# Patient Record
Sex: Male | Born: 1985 | Race: White | Hispanic: No | Marital: Married | Smoking: Former smoker
Health system: Western US, Academic
[De-identification: ages and names within clinical notes are randomized; demographics above are authoritative.]

## PROBLEM LIST (undated history)

## (undated) MED ORDER — TESTOSTERONE CYPIONATE 100 MG/ML IJ SOLN
0.75 mg | INTRAMUSCULAR | 0 refills | Status: AC
Start: 2021-10-27 — End: 2022-10-12

---
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
## 2020-06-17 IMAGING — MR MRI LSPINE WO CONTRAST
5 of 6 series · 35 of 48 positions shown · non-contrast
Comparison: None.

INDICATION: Low back pain.
TECHNIQUE: Multiplanar, multiecho MR imaging of the lumbar spine was performed, including T1-weighted and fluid sensitive sequences without intravenous contrast.

[Series 16: t2_sag · sagittal · 4.0mm · 0.77mm/px · 7 of 19 slices shown]
[im 1/19]
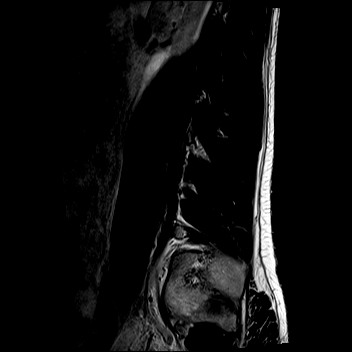
[im 4/19]
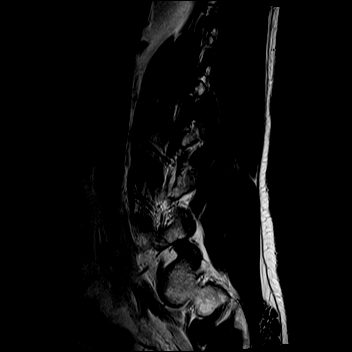
[im 7/19]
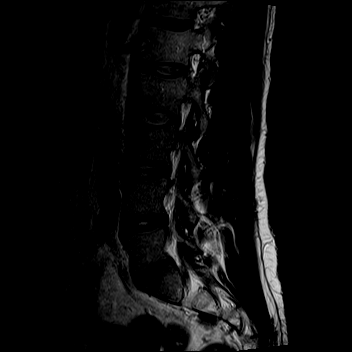
[im 10/19]
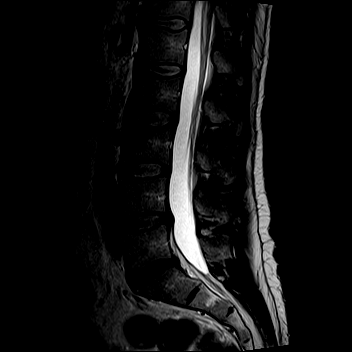
[im 13/19]
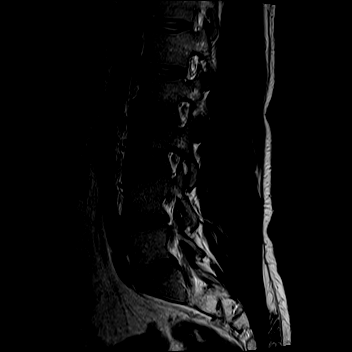
[im 16/19]
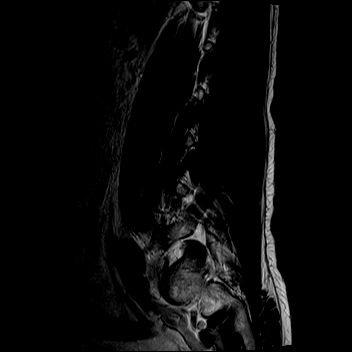
[im 19/19]
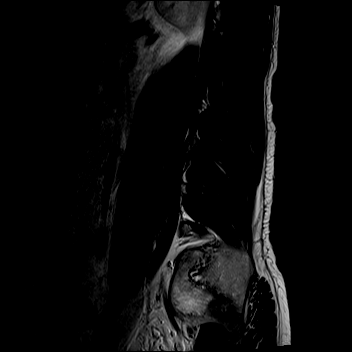

[Series 17: t1_sag · sagittal · 4.0mm · 0.84mm/px · 6 of 19 slices shown]
[im 1/19]
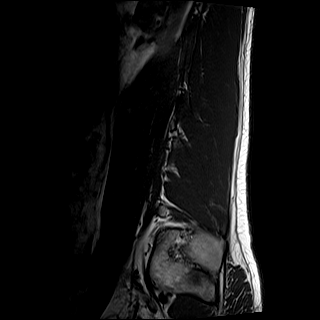
[im 4/19]
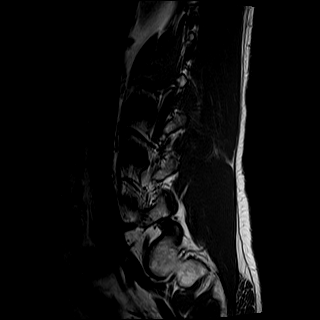
[im 8/19]
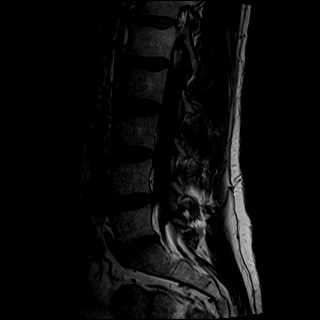
[im 11/19]
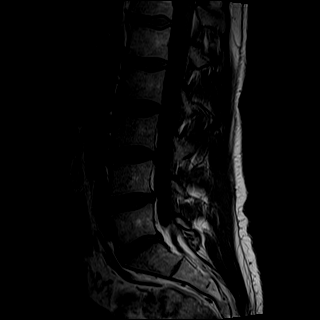
[im 15/19]
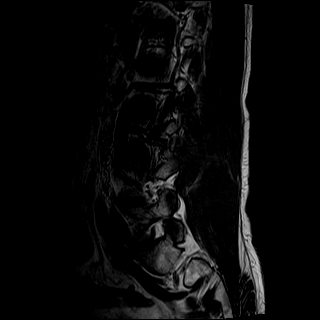
[im 19/19]
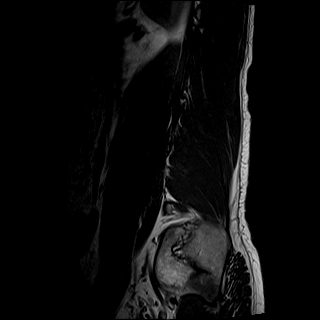

[Series 18: ir_sag · sagittal · 4.0mm · 0.47mm/px · 6 of 19 slices shown]
[im 1/19]
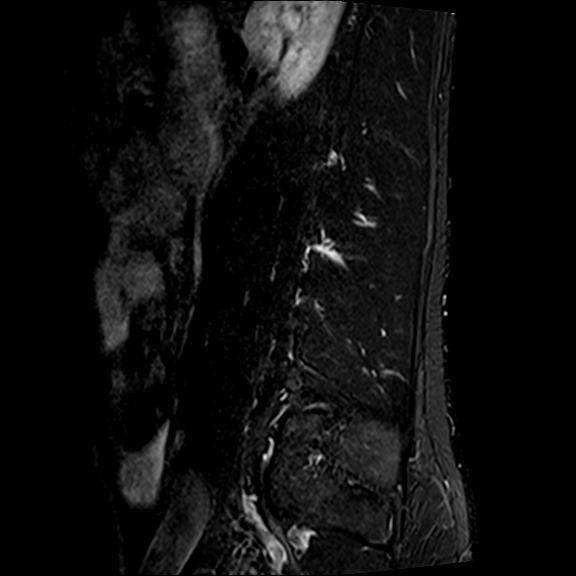
[im 4/19]
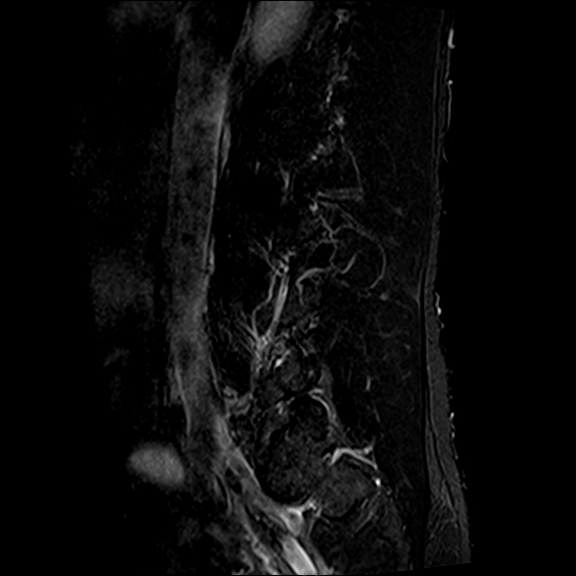
[im 8/19]
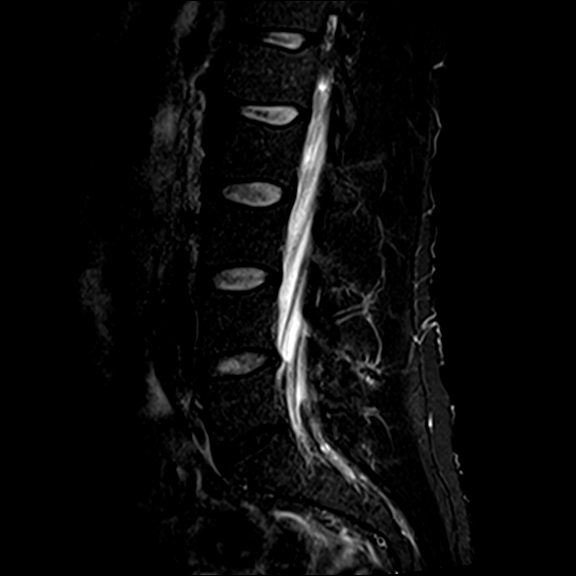
[im 11/19]
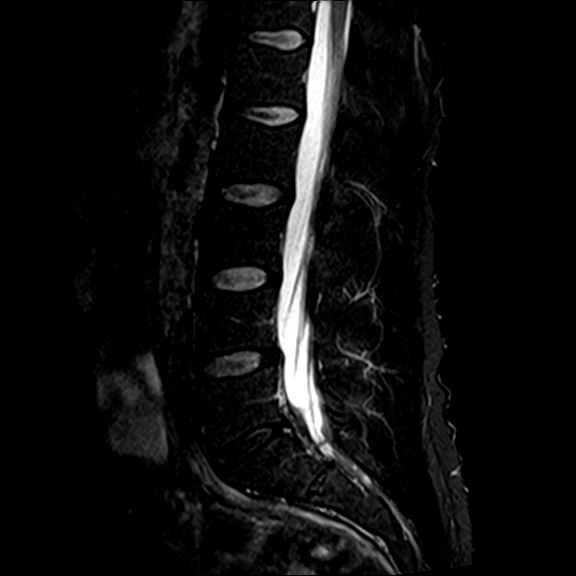
[im 15/19]
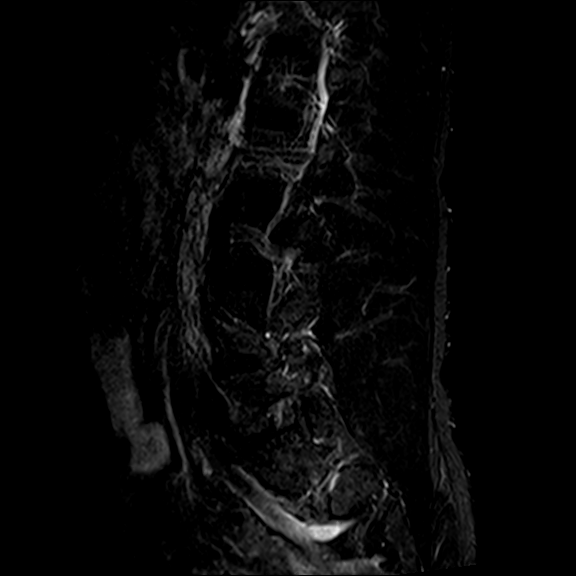
[im 19/19]
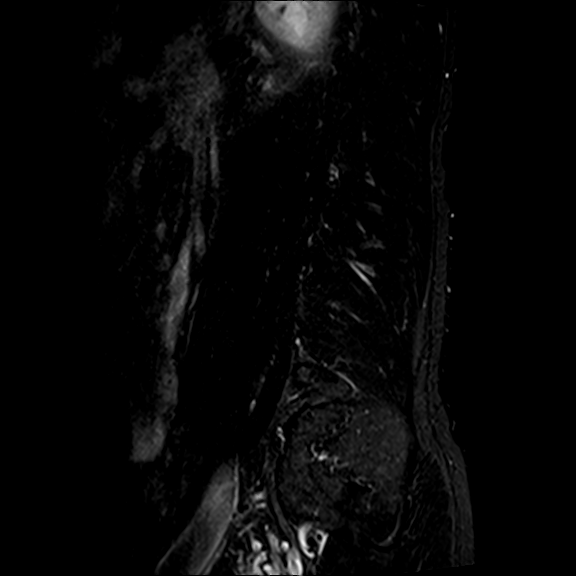

[Series 19: t2_axial · axial · 4.0mm · 0.62mm/px · z∈[-522,-321]mm · 11 of 50 slices shown]
[im 4/50]
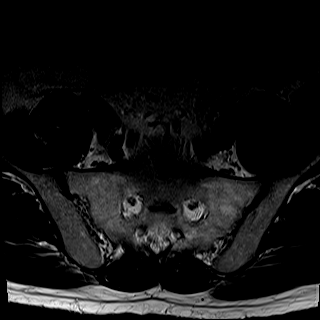
[im 7/50]
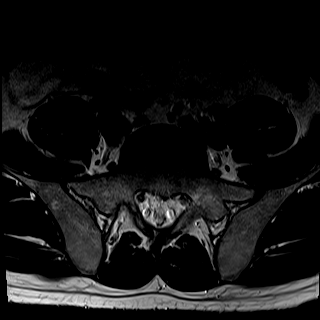
[im 10/50]
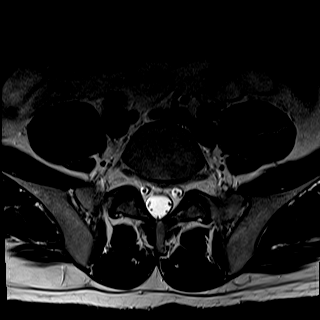
[im 16/50]
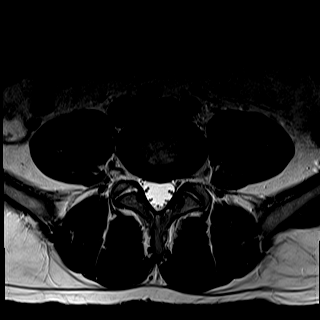
[im 22/50]
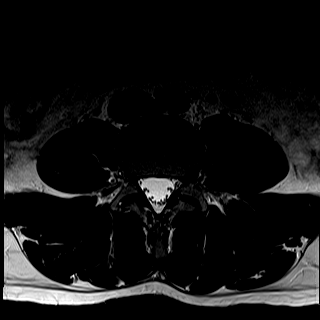
[im 25/50]
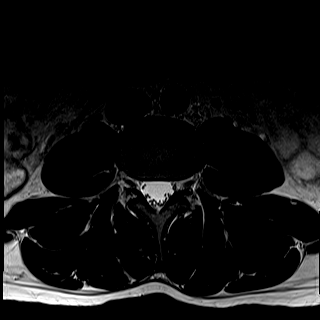
[im 28/50]
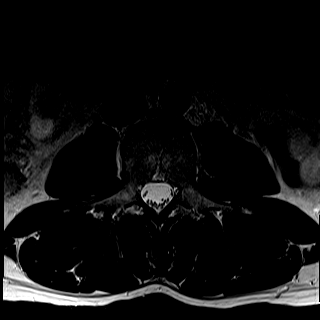
[im 34/50]
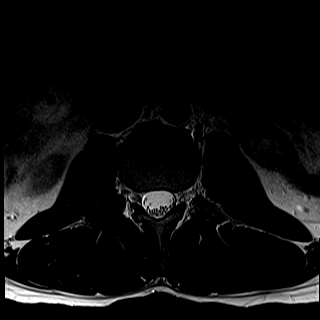
[im 40/50]
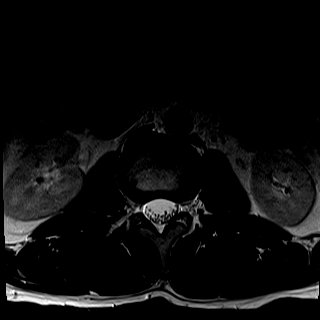
[im 43/50]
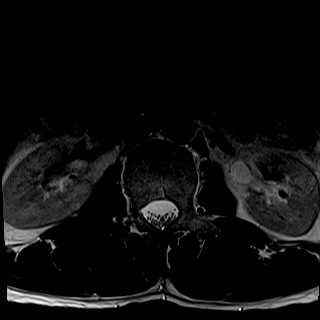
[im 46/50]
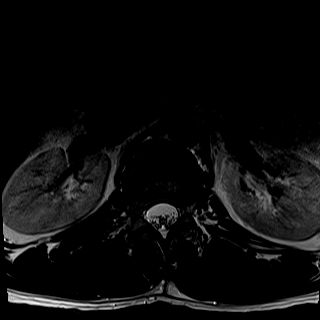

[Series 20: t1_axial_obl · axial · 3.0mm · 0.78mm/px · z∈[-540,-375]mm · 5 of 31 slices shown]
[im 1/31]
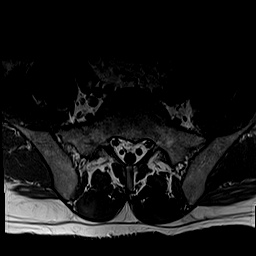
[im 7/31]
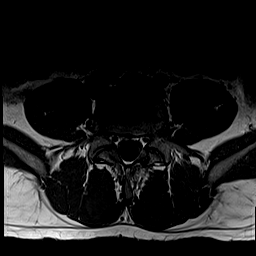
[im 10/31]
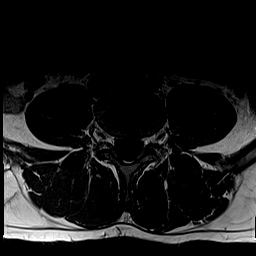
[im 13/31]
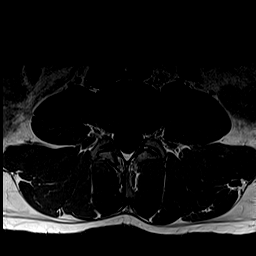
[im 19/31]
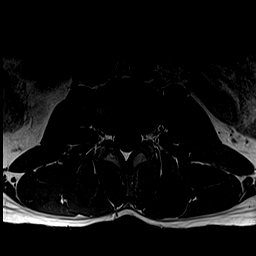

[35 of 48 positions shown; findings below may reference images not displayed]

FINDINGS: Conus: The conus is normal in appearance and position.  

Alignment: The alignment of the lumbar spine is normal on these supine, neutral images.   

Marrow: No acute fracture. No pars defect. Minimal discogenic endplate reactive marrow changes at L5-S1.  

T12-L1: No disc protrusion. No canal or foraminal stenosis. No facet arthrosis.

L1-L2: No disc protrusion, canal stenosis, or foraminal stenosis.

L2-L3: No disc protrusion, canal stenosis, or foraminal stenosis.

L3-L4: No disc protrusion. No canal stenosis. No foraminal stenosis.

L4-L5: Mild disc bulge. No facet arthrosis. No canal stenosis. No foraminal stenosis.

L5-S1: Mild disc bulge. No facet arthrosis. No canal or foraminal stenosis.

Visualized SI joints: Intact.

Visualized soft tissues: Unremarkable.
IMPRESSION: Mild disc bulge at L4-L5 and L5-S1 with no canal or foraminal stenosis.

## 2021-05-07 DIAGNOSIS — E237 Disorder of pituitary gland, unspecified: Secondary | ICD-10-CM | POA: Insufficient documentation

## 2021-05-07 DIAGNOSIS — K219 Gastro-esophageal reflux disease without esophagitis: Secondary | ICD-10-CM | POA: Insufficient documentation

## 2021-05-07 DIAGNOSIS — R6889 Other general symptoms and signs: Secondary | ICD-10-CM | POA: Insufficient documentation

## 2021-05-30 IMAGING — MR MRI LSPINE WO CONTRAST
7 series · 48 of 48 positions shown · non-contrast
Comparison: MRI lumbar spine 06/17/20

HISTORY: 35 year-old male with low back pain .
TECHNIQUE: Multiplanar, multisequential MR images of the lumbar spine were obtained without intravenous contrast.

[Series 9: iii_aaspine_lspine · coronal · 1.7mm · 1.67mm/px · 27 of 160 slices shown]
[im 1/160]
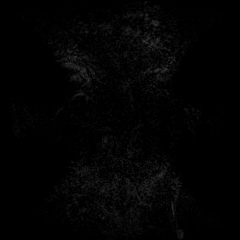
[im 7/160]
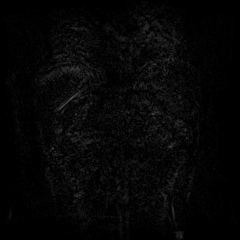
[im 13/160]
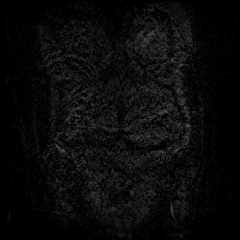
[im 19/160]
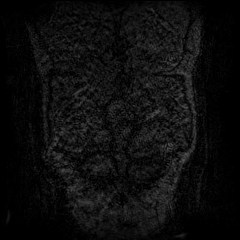
[im 25/160]
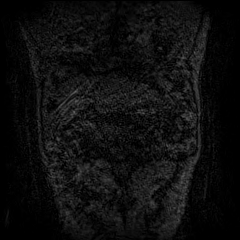
[im 31/160]
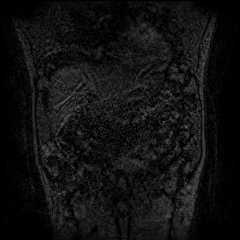
[im 37/160]
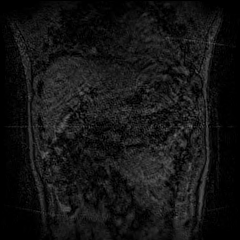
[im 43/160]
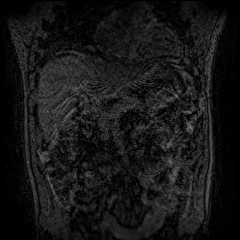
[im 49/160]
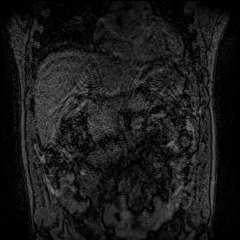
[im 56/160]
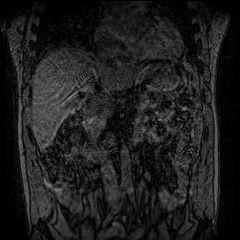
[im 62/160]
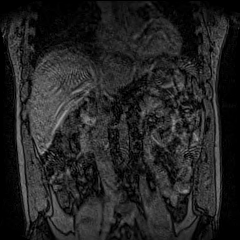
[im 68/160]
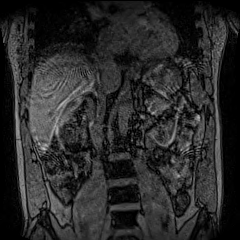
[im 74/160]
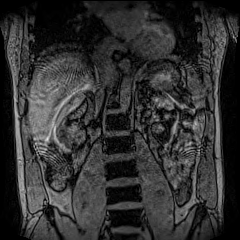
[im 80/160]
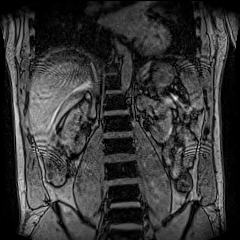
[im 86/160]
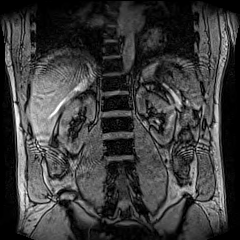
[im 92/160]
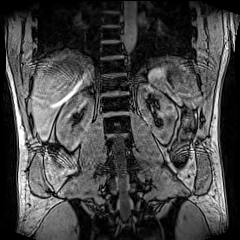
[im 98/160]
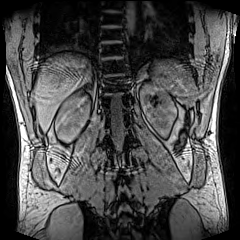
[im 104/160]
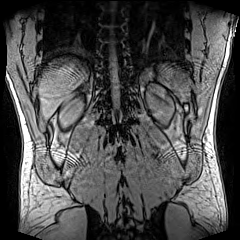
[im 111/160]
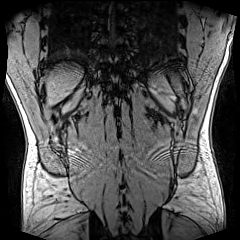
[im 117/160]
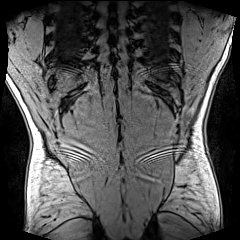
[im 123/160]
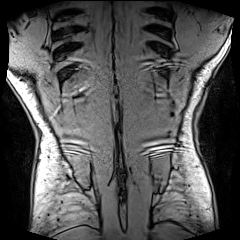
[im 129/160]
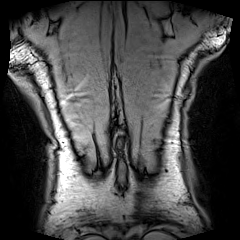
[im 135/160]
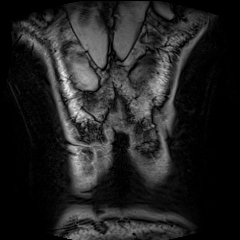
[im 141/160]
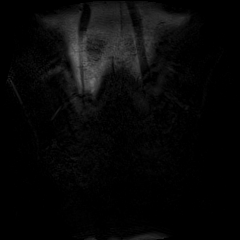
[im 147/160]
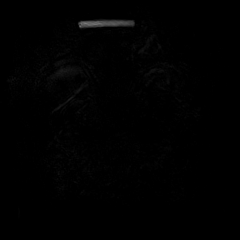
[im 153/160]
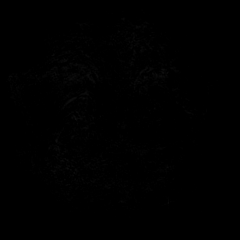
[im 160/160]
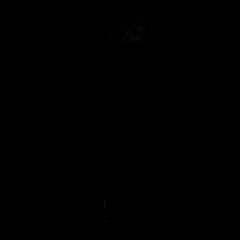

[Series 16: t2_sag · sagittal · 4.0mm · 0.74mm/px · 3 of 15 slices shown]
[im 1/15]
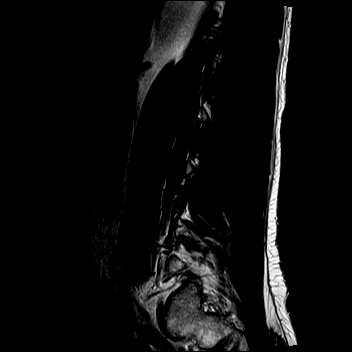
[im 8/15]
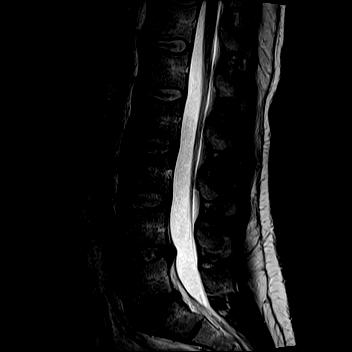
[im 15/15]
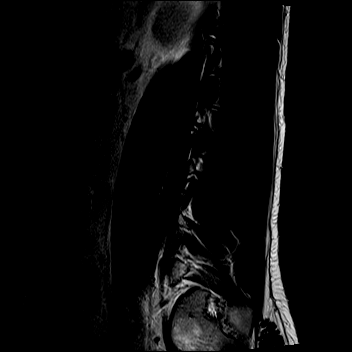

[Series 17: t1_sag · sagittal · 4.0mm · 0.81mm/px · 3 of 15 slices shown]
[im 1/15]
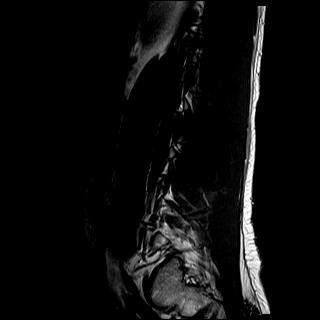
[im 8/15]
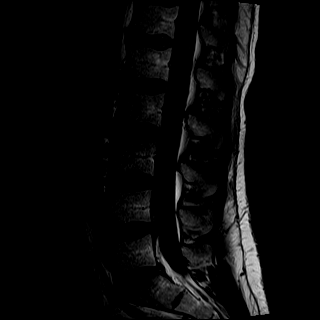
[im 15/15]
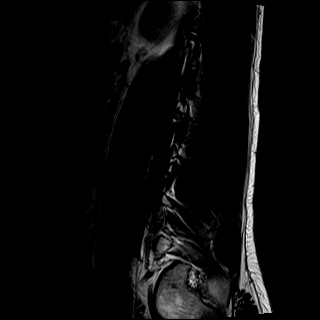

[Series 18: ir_sag · sagittal · 4.0mm · 0.90mm/px · 3 of 15 slices shown]
[im 1/15]
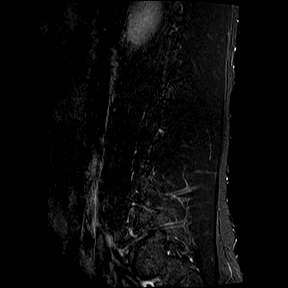
[im 8/15]
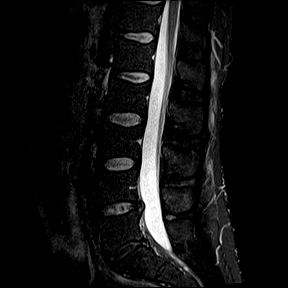
[im 15/15]
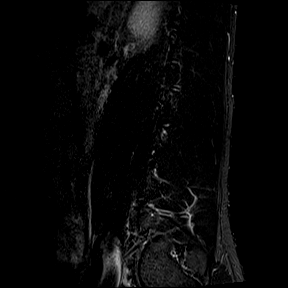

[Series 19: t1_axial_obl · axial · 3.5mm · 0.78mm/px · z∈[-641,-435]mm · 4 of 25 slices shown]
[im 1/25]
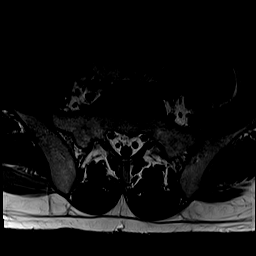
[im 9/25]
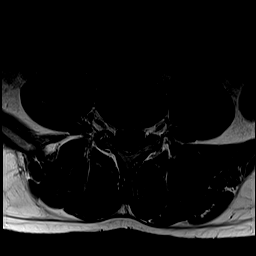
[im 17/25]
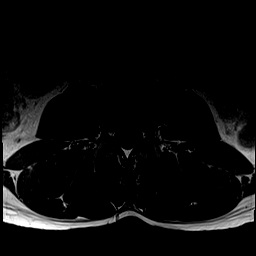
[im 25/25]
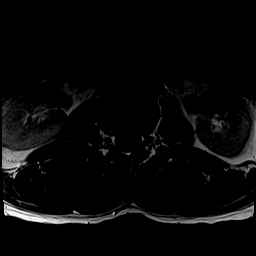

[Series 20: t2_axial · axial · 4.0mm · 0.62mm/px · z∈[-642,-438]mm · 7 of 42 slices shown]
[im 1/42]
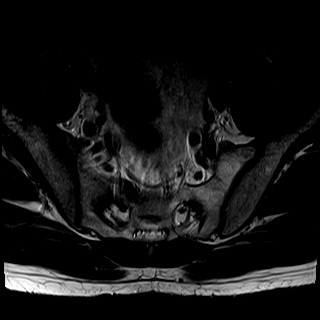
[im 7/42]
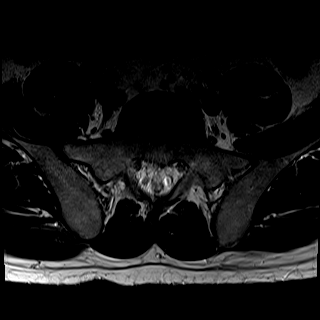
[im 14/42]
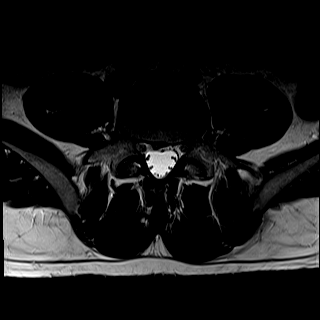
[im 21/42]
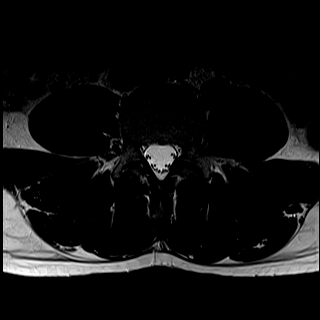
[im 28/42]
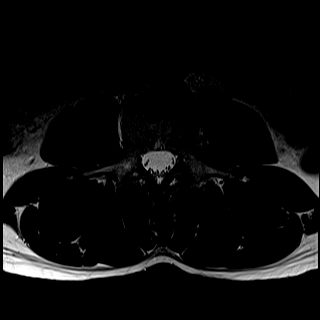
[im 35/42]
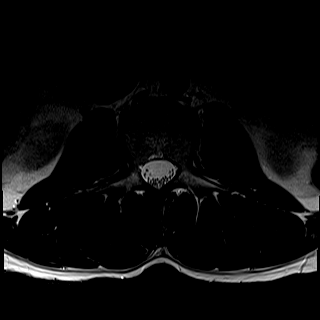
[im 42/42]
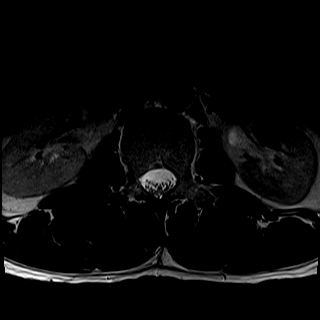

[Series 5006: autoalign verification_new · 0.51mm/px · 1 of 3 slices shown]
[im 1/3]
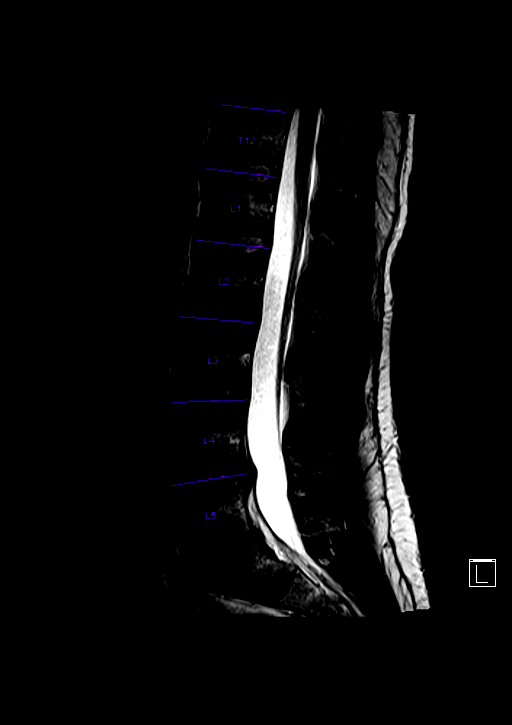

[48 of 48 positions shown; findings below may reference images not displayed]

FINDINGS: Spine labeling: Based on counting caudally using the whole spine localizer.

Alignment: No subluxation.  

Vertebrae and discs: Mild degenerative disc disease from L4 to S1. Preserved vertebral body heights. 

Marrow: No suspicious osseous lesion or acute fracture.

Conus: Ends at a normal level. Unremarkable cauda equina nerve roots.

T12-L1: No significant canal or neural foraminal stenosis.

L1-L2: No significant canal or neural foraminal stenosis.

L2-L3: No significant canal or neural foraminal stenosis.

L3-L4: No significant canal or neural foraminal stenosis.

L4-L5: Mild disc bulge. New right central disc extrusion with mild inferior migration measuring 15 x 4 x 12 mm. Associated annular fissure. Moderate narrowing of the right lateral recess with possible impingement of the descending right L5 nerve root. No significant neural foraminal stenosis.

L5-S1: Mild disc bulge. Small posterior annular fissure. No significant canal or neural foraminal stenosis.

The paravertebral soft tissues are unremarkable.
IMPRESSION: 1.
New right central disc extrusion at L4-L5 narrows the right lateral recess and possibly impinges the descending right L5 nerve root.

2.
No high-grade canal or neural foraminal stenosis.

3.
Annular fissures at L4-L5 and L5-S1, potential discogenic source of pain.

## 2021-06-08 ENCOUNTER — Encounter (INDEPENDENT_AMBULATORY_CARE_PROVIDER_SITE_OTHER): Payer: Self-pay | Admitting: Family Practice

## 2021-06-08 ENCOUNTER — Other Ambulatory Visit: Payer: Self-pay

## 2021-06-08 ENCOUNTER — Encounter (INDEPENDENT_AMBULATORY_CARE_PROVIDER_SITE_OTHER): Payer: Self-pay | Admitting: Internal Medicine

## 2021-06-08 ENCOUNTER — Telehealth (INDEPENDENT_AMBULATORY_CARE_PROVIDER_SITE_OTHER): Payer: TRICARE Prime—HMO | Admitting: Internal Medicine

## 2021-06-08 DIAGNOSIS — Z7689 Persons encountering health services in other specified circumstances: Secondary | ICD-10-CM

## 2021-06-08 DIAGNOSIS — M545 Low back pain, unspecified: Secondary | ICD-10-CM | POA: Insufficient documentation

## 2021-06-08 DIAGNOSIS — Z840 Family history of diseases of the skin and subcutaneous tissue: Secondary | ICD-10-CM | POA: Insufficient documentation

## 2021-06-08 DIAGNOSIS — Z86018 Personal history of other benign neoplasm: Secondary | ICD-10-CM

## 2021-06-08 MED ORDER — OMEPRAZOLE 20 MG OR CPDR
DELAYED_RELEASE_CAPSULE | ORAL | Status: DC
Start: 2018-09-30 — End: 2021-10-14

## 2021-06-08 MED ORDER — METHOCARBAMOL 500 MG OR TABS
500.00 mg | ORAL_TABLET | Freq: Two times a day (BID) | ORAL | Status: DC
Start: 2021-05-29 — End: 2021-10-14

## 2021-06-08 MED ORDER — OMEPRAZOLE PO
ORAL | Status: DC
Start: ? — End: 2021-10-14

## 2021-06-08 MED ORDER — TESTOSTERONE CYPIONATE 100 MG/ML IJ SOLN
INTRAMUSCULAR | Status: DC
Start: 2017-08-29 — End: 2021-10-29

## 2021-06-08 MED ORDER — ACETAMINOPHEN EXTRA STRENGTH 500 MG OR TABS
ORAL_TABLET | ORAL | Status: DC
Start: 2021-05-29 — End: 2021-10-14

## 2021-06-08 MED ORDER — TESTOSTERONE BU
BUCCAL | Status: DC
Start: ? — End: 2021-10-29

## 2021-06-08 NOTE — Interdisciplinary (Unsigned)
Pre-visit chart review and huddle completed with staff and physician.    Outstanding labs, imaging and consults reviewed and identified.    Health maintanence issues identified and addressed:    Health Maintenance   Topic Date Due   . PHQ2 depression screen  Never done   . Hepatitis C Screening  Never done   . Universal HIV Screening  Never done   . Lipid Screening  Never done   . Tetanus (1 - Tdap) Never done   . COVID-19 Vaccine (3 - Booster for Moderna series) 09/18/2019   . Influenza (1) Never done   . Polio Vaccine  Aged Out   . HPV Vaccine <= 26 Yrs  Aged Out   . Meningococcal MCV4 Vaccine  Aged Out   . Pneumococcal Vaccine  Aged Out

## 2021-06-08 NOTE — Progress Notes (Deleted)
-  back injury in gym march 31st, went to the ED and d/c same day  -back pain 5/10, worse with sitting, L>R back and buttock (hx of piriformis pain)  -does not have paresthesia in leg, radiating pain,   -exercise: 4-5 days per week, 1hr per day with weights  -Back MRI April 1st mild DDD L4-21, mild L4-L5 disc bulge, L5 nerve root impingement, L5-s1 mild disc bulge  -wants to do PT:  -surgery: 36 years old bilateral ear tube removal   -medication: omeprazole 10mg  PO prn, 0.75mg  testosterone once q2weeks   -will sign ROI to release records  -renal failure a week ago  -2/2 to dehydration   -occurred in  -moved to [place] last Thursday, with wife (going well), works remote IT (going well)  -nicotine pouch/chewing tobacco daily, smoked tobacco 18-28 y/o, vaping 3-60yrs  -etoh socially, 1-3 drinks avg, 3-5 days per week  -denies illicit substances  -dad died 06-18-19 from cholangiocarcinoma/pancreatic cancer,   -skin cancer: maternal (aunt) and paternal (gmom) melonoma  -removal of precancerous mole on back, 17-Jun-2017  -last saw dermatologist in 06/17/2017, recommened to see them yearly but has been busy with travel, denies new skin lesions but interested in Derm referral  -typically drinks 1 gallon water, fast food over past few weeks, typically eats veggiens, fruits, high protein

## 2021-06-08 NOTE — Progress Notes (Addendum)
[facility] Telemedicine Note      (data below generated by Suzy Bouchard, MD)    Patient Verification & Telemedicine Consent:    I am proceeding with this evaluation at the direct request of the patient.  I have verified this is the correct patient and have obtained verbal consent and written consent from the patient/ surrogate to perform this voluntary telemedicine evaluation (including obtaining history, performing examination and reviewing data provided by the patient).   The patient/ surrogate has the right to refuse this evaluation.  I have explained risks (including potential loss of confidentiality), benefits, alternatives, and the potential need for subsequent face to face care. Patient/ surrogate understands that there is a risk of medical inaccuracies given that our recommendations will be made based on reported data (and we must therefore assume this information is accurate).  Knowing that there is a risk that this information is not reported accurately, and that the telemedicine video, audio, or data feed may be incomplete, the patient agrees to proceed with evaluation and holds [place] harmless knowing these risks. In this evaluation, we will be providing recommendations only.  The ultimate decision to follow, or not follow, these recommendations will be left to the bedside treating/ requesting practitioner.  The patient/ surrogate has been notified that other healthcare professionals (including students, residents and Engineer, maintenance) may be involved in this audio-video evaluation.   All laws concerning confidentiality and patient access to medical records and copies of medical records apply to telemedicine.  The patient/ surrogate has received the [place] Notice of Privacy Practices.  I have reviewed this above verification and consent paragraph with the patient/ surrogate.  If the patient is not capacitated to understand the above, and no surrogate is available, since this is not  an emergency evaluation, the visit will be rescheduled until such time that the patient can consent, or the surrogate is available to consent.    Demographics:   Medical Record #: [id number]   Date: June 08, 2021   Patient Name: Jonathan Ponce   DOB: 02-08-1986  Age: 36 year old  Sex: male  Location: Home address on file      Evaluator(s):   Jonathan Ponce was evaluated by me today.    Clinic Location:  [facility]  [facility]  [address]  [place] [address]    30 minutes of what became a 30 minute appointment was spent face to face with patient/cargiver in coordinating care and counseling for the below issues.  _______________________________________________________        CC:    Chief Complaint   Patient presents with   . Referral/authorization   . Back Pain         History of Present Illness    Jonathan Ponce is a 36 year old male who presents to establish care.     Low Back Pain  -back injury in gym march 31st, 2023   -went to the ED and d/c same day  -back pain 5/10, worse with sitting, L>R back and buttock (hx of piriformis pain)  -does not have paresthesia/weakness/radiating pain  in leg  -denies urinary or stool incontinence or perineal pain  -exercises: 4-5 days per week, 1hr per day with weights  -Reports having Back MRI April 1st: notable for  mild DDD L4-S1, mild L4-L5 disc bulge, L5 nerve root impingement, L5-S1 mild disc bulge (aggreable to send in records to May for review)  -  wants to do PT for low back pain  -takes tylenol 500-1000mg  PO qday prn     Hx of Precancersousmole  -removal of precancerous mole on back, 2019  -last saw dermatologist in 2019, recommened to see them yearly but has been busy with travel, denies new skin lesions but interested in Derm referral    AKI  -reports during ED visit above was noted to have   -renal failure a week ago  -2/2 to dehydration   -currently has increased PO and water intake (working to drink 1 gallon  water per day)  -denies urinary obstructive symptoms, dysuria, hematuria, CVA tenderness, abdominal pain, fevers, chills    Soc HX  -[place]oved from [place] to [place] last 06/04/21, with wife (going well), works remote IT (going well)  -nicotine pouch/chewing tobacco daily, smoked tobacco 18-28 y/o, vaping 3-7129yrs  -etoh socially, 1-3 drinks avg, 3-5 days per week  -denies illicit substances  -diet: typically drinks 1 gallon water, fast food over past few weeks, typically eats veggiens, fruits, high protein     Surg Hx  -surgery: 36 years old bilateral ear tube removal   -medication: omeprazole 10mg  PO prn, 0.75mg  testosterone once q2weeks   -will sign ROI to release records     FM Hx  -dad died 2021 from cholangiocarcinoma/pancreatic cancer,   -skin cancer: maternal (aunt) and paternal (gmom) melonoma        Review of Systems  -see hpi    Problem List    There is no problem list on file for this patient.      Medication List   Active Medications  Outpatient Medications Prior to Visit   Medication Sig Dispense Refill   . ACETAMINOPHEN EXTRA STRENGTH 500 MG tablet TAKE 1 TABLET BY MOUTH EVERY 6 HOURS AS NEEDED FOR PAIN OR FEVER     . methocarbamol (ROBAXIN) 500 MG tablet Take 1 tablet (500 mg) by mouth 2 times daily.     Marland Kitchen. omeprazole (PRILOSEC) 20 MG capsule      . OMEPRAZOLE PO      . TESTOSTERONE BU      . Testosterone Cypionate 100 MG/ML SOLN        No facility-administered medications prior to visit.           OBJECTIVE  General: Appears stated age. Well appearing, Alert and oriented X 3  HEENT: Head atraumatic, Normocephalic. Moist mucous membranes, anicteric sclera  Lung: No increased work of breathing on room air  Neuro: CN II-XII appear grossly intact. Spontaneously moving all extremities.   Psych: Patient is cooperative and pleasant  Skin: No lesions/rashes noted on exposed surfaces      Labs  No results found for this or any previous visit.    Imaging  No results found.        ASSESSMENT/PLAN  Jonathan Ponce is a  36 year old male seen today via video visit to establish care and referral/authorization and back pain.    Acute bilateral low back pain without sciatica  Patient reports recent episode of low back injury during weight lifting/exercising with outside hospital MRI notable for DDD and spinal disc bulging. Currently patient has 5/10 pain managed with tylenol and denies any emergent or neurologic lower back or lower extremity symptoms. Patient agreeable to sign ROI for recent medical workup/records. Patient encouraged to continue tylenol treatment prn and this provider will place PT referral for further management.   -     Consult to [facility] -  Internal; Future    Family history of diseases of the skin and subcutaneous tissue  Patient reports personal hx of precancerous skin lesion (mole) removal with family history of melanoma, though patient denies any new lesions on his body but has seen dermatology in the past and recommended to follow up yearly. Will place dermatology referral for further evaluation.   -     Consult/Referral to Dermatology General - Evaluation of Moles  -     Consult/Referral to Dermatology General - Family History of Skin Cancer    Encounter to establish care  Patient has presented to establish care, with recent move from [place]. Overall patient is doing well (aside from issues above). Patient reports recently seeing his old PCP on 06/01/21, with recent lab work done. He reported to have an AKI while in the ED on 05/29/21 that resolved on 06/01/21 labs. Patient agreed to sign ROI for release of labs. Once reviewed, if any additional labs are needed, will order and followup with patient.       Patient was seen and staffed with attending physician Dr. Lorne Skeens, MD  PGY 5 Psychiatry/Family Medicine   Pager: [phone]      _______________________________________________________

## 2021-06-17 ENCOUNTER — Encounter (HOSPITAL_COMMUNITY): Payer: Self-pay

## 2021-06-22 ENCOUNTER — Encounter (INDEPENDENT_AMBULATORY_CARE_PROVIDER_SITE_OTHER): Payer: Self-pay | Admitting: Internal Medicine

## 2021-06-23 NOTE — Telephone Encounter (Signed)
From: Lowella Bandy  To: Suzy Bouchard, MD  Sent: 06/22/2021 7:41 PM PDT  Subject: Medication refill    Hello Dr. Chauncey Reading,    I'm unable to refill my prescriptions on the MyChart app. How should I go about it when the time comes?    Thanks!  Leonette Most

## 2021-06-25 ENCOUNTER — Ambulatory Visit
Admission: RE | Admit: 2021-06-25 | Discharge: 2021-06-25 | Disposition: A | Discharge status: Home / Self Care | Payer: TRICARE Prime—HMO | Attending: Rehabilitative and Restorative Service Providers" | Admitting: Rehabilitative and Restorative Service Providers"

## 2021-06-25 DIAGNOSIS — M545 Low back pain, unspecified: Secondary | ICD-10-CM | POA: Insufficient documentation

## 2021-06-25 DIAGNOSIS — R2689 Other abnormalities of gait and mobility: Secondary | ICD-10-CM | POA: Insufficient documentation

## 2021-06-25 NOTE — Interdisciplinary (Signed)
Physical Therapy Evaluation    Ordering Physician: Franz Dell    Visit Diagnosis:    ICD-10-CM ICD-9-CM    1. Decreased functional mobility  R26.89 781.99       2. Acute bilateral low back pain without sciatica  M54.50 724.2      338.19         Visit Number: 1     Insurance: TRICARE    No past medical history on file.    Past Surgical History:   Procedure Laterality Date   . EAR TUBE REMOVAL Bilateral 1990       Allergies: Patient has no known allergies.    Current Outpatient Medications   Medication Sig Dispense Refill   . ACETAMINOPHEN EXTRA STRENGTH 500 MG tablet TAKE 1 TABLET BY MOUTH EVERY 6 HOURS AS NEEDED FOR PAIN OR FEVER     . methocarbamol (ROBAXIN) 500 MG tablet Take 1 tablet (500 mg) by mouth 2 times daily.     Marland Kitchen omeprazole (PRILOSEC) 20 MG capsule      . OMEPRAZOLE PO      . TESTOSTERONE BU      . Testosterone Cypionate 100 MG/ML SOLN        No current facility-administered medications for this encounter.     Start of Care: 06/25/2021                 Weeks since Surgery/Injury (0 if N/A): 0    SUBJECTIVE    Onset date: 1 year ago  Mechanism of injury: working out  History of presenting condition: Was working out and developed low back pain, 04/2021 was working out again and developed excruciating left low back and and gluet.  Symptom progression since onset: better    Pain levels:  Pain Location: left low back and gluet.    Pain Quality: dull ache  Intensity: Current:  1/10      At best: 1/10                 At worst: 6/10  Frequency: Constant    Numbness and Tingling: no  What increases symptoms:  sitting  What decreases symptoms:  resting  Pain Irritability: Low  24 Hour Pattern: N/A  Sleep Disturbance: Yes    Previous treatment for condition: none  Diagnostic tests: MRI  Employment Status: Working [employer] sits  Prior level of function: Independent-No Deficits  Functional limitations: none  Fall history: No  Home Environment: lives with wife  Patient goals:  lift legs  Other  subjective/PMH/Hobbies:  working out, Leisure centre manager    Unexplained weight loss No   MVA with rollover or high speed No   Osteoporosis No   History of cancer No   Unremitting night pain No   Recent infection No   Saddle anesthesia No   Loss of bowel or bladder control associated with back pain onset No   Difficulty with balance associated with back pain onset No   Symptom with Cough/Sneeze  No     OBJECTIVE    Modified Oswestry Low Back Disability Questionnaire     Section 1 - Pain intensity   0 I can tolerate the pain I have without having to use pain medication.  1 The pain is bad, but I can manage without having to take pain medication.  2 Pain medication provides me with complete relief from pain.  3 Pain medication provides me with moderate relief from pain.  4 Pain medication provides  me with little relief from pain.  5 Pain medication has no effect on my pain. 0   Section 2 - Personal care (washing, dressing etc)   0 I can take care of myself normally without causing increased pain.  1 I can take care of myself normally, but it increases my pain.  2 It is painful to take care of myself, and I am slow and careful.  3 I need help, but I am able to manage most of my personal care.  4 I need help every day in most aspects of my care.  5 I do not get dressed, I wash with difficulty, and I stay in bed 0   Section 3 - Lifting  0  I can lift heavy weights without extra pain  1 I can lift heavy weights but it gives extra pain  2 Pain prevents me from lifting heavy weights off the floor, but I can manage if they are conveniently placed eg. on a table  3 Pain prevents me from lifting heavy weights, but I can manage light to medium weights if they are conveniently positioned  4 I can lift very light weights  5 I cannot lift or carry anything at all  1   Section 4 - Walking*  0 Pain does not prevent me walking any distance  1 Pain prevents me from walking more than 1 mile.   2 Pain prevents me from walking  more than 1/2 mile.  3 Pain prevents me from walking more than 1/4 mile.   4 I can only walk using a stick or crutches  5 I am in bed most of the time 0   Section 5 - Sitting  0 I can sit in any chair as long as I like  1 I  can only sit in my favourite chair as long as I like  2 Pain prevents me sitting more than one hour  3 Pain prevents me from sitting more than 30 minutes  4 Pain prevents me from sitting more than 10 minutes  5 Pain prevents me from sitting at all  2   Section 6 - Standing  0 I can stand as long as I want without extra pain  1 I can stand as long as I want but it gives me extra pain  2 Pain prevents me from standing for more than 1 hour  3 Pain prevents me from standing for more than 30 minutes  4 Pain prevents me from standing for more than 10 minutes  5 Pain prevents me from standing at all  1   Section 7 - Sleeping  0 My sleep is never disturbed by pain  1 My sleep is occasionally disturbed by pain  2 Because of pain I have less than 6 hours sleep  3 Because of pain I have less than 4 hours sleep  4 Because of pain I have less than 2 hours sleep  5 Pain prevents me from sleeping at all  1   Section 8 - Employment / Homemaking  0 My normal homemaking / job activities do not cause pain.  1 My normal homemaking / job activities increase my pain, but I can still perform all that is required of me.  2 I can perform most of my homemaking / job duties, but pain prevents me from performing more physically stressful activities (e.g., lifting, vacuuming).  3 Pain prevents me from doing anything but light duties.  4 Pain prevents  me from doing even light duties.  5 Pain prevents me from performing any job or homemaking chores. 1   Section 9 - Social Life  0 My social life is normal and does not increase my pain.  1 My social life is normal, but it increases my level of pain.  2  Pain prevents me from participating in more energetic activities (e.g., sports, dancing).  3 Pain prevents me from going out  very often.  4 Pain has restricted my social life to my home.  5 I have hardly any social life because of my pain 1   Section 10 - Travelling  0 I can travel anywhere without pain  1 I can travel anywhere but it gives me extra pain  2 Pain is bad but I manage journeys over two hours  3 Pain restricts me to journeys of less than one hour  4 Pain restricts me to short necessary journeys under 30 minutes  5 Pain prevents me from travelling except to receive treatment   1   SCORE Total Scored / Total Possible Score x 100 = 16%     Minimum detectable change (90% confidence): 10%  (6) points (change of less than this may be attributable to  error in the measurement)    Source: Truitt MerleFritz JM, Cherlynn KaiserIrrgang JJ. A comparison of a modified Oswestry Low Back Pain Disability   Questionnaire and the [place] Back Pain Disability Scale. Physical Therapy. 2001;81:776-788.  a  Modified by Milly JakobFritz & Irrgang with permission of The Chartered Society of Physiotherapy, from  [facility], Jesse FallCouper J, Davies JB, et al. The Oswestry Low Back Pain Disability Questionnaire. Physiotherapy.  1980;66:271-273.     Minimum detectable change (90% confidence): 10%  (6) points (change of less than this may be attributable to  error in the measurement)         AROM Lumbar    Lumbar flexion  Min loss of motion  Repeated movement increase/ worse  Lumbar extension mod loss of motion  Repeated extension no change   Repeated supine flexion no change   Prone extension repeated movement increase/no worse    Posture poor    HEP  Prone first thing in the morning.  Extension stretches every 2 hours.  Log roll in/out of bed.  Use of Lumbar roll.  Stand every 30 minutes.  Golf pick up.  Off Load           ASSESSMENT  Rehabilitation Potential: Good    Patient presents with  Back MRI April 1st: notable for  mild DDD L4-S1, mild L4-L5 disc bulge, L5 nerve root impingement, L5-S1 mild disc bulge (aggreable to send in records to [facility] for review). Deficits include increase pain from  sitting, limiting the patient from working out.  Patient will benefit from skilled therapy to address impairments and improve function. Patient was given a home exercise program to begin addressing evaluation findings and was educated on how to perform effectively and safely. Patient was instructed to perform home exercise program as tolerated. Discussed evaluation findings with patient and educated on therapy potential and plan of care.    Plan for next visit: Review Gym exercises and modify if needed    TherEx:  Lumbar extension     Neuromuscular Re-education  Use of lumbar roll  Stand every 30 minutes    Patient Education:  Log roll in/out of bed.  Golf pick up.  Off Load        Goals  Goal  1 Impairment: Education need  Goal 1: Patient able to return demonstrate Home Exercise Program independently to enable patient to achieve stated functional goals     Number of Visits: 3-5  Status :New                              Long Term Goal Impairment: Activity Tolerance Limitation  Long Term Goal: Go back to gym  Number of Visits: 5-7  Status: New          Long Term Goal Outcome: Patient will decrease Modified ODI by MCID of at least 13% to reflect clinically significant positive change in function since starting therapy  Number of Visits: 5-7  Status: New                  PLAN  Continue therapy to address: Pain;Activity tolerance limitation  Treatment Frequency: 2 times per month  Treatment Duration: 12 weeks       Type of Eval  Low Complexity (13086): Completed  Therapeutic Procedures     Total TIMED Treatment (min) : 15    Neuromuscular re-education  : see objective     Total TIMED Treatment (min) : 15    Therapeutic exercise  : see objective                  Treatment Time   Total TIMED Treatment  (min): 30  Total Treatment Time (min): 60    Therapist and Patient discussed treatment plan for     ICD-10-CM ICD-9-CM    1. Decreased functional mobility  R26.89 781.99       2. Acute bilateral low back pain without  sciatica  M54.50 724.2      338.19        and all parties agreeable to the following recommendations.    Planned Therapy Interventions: Patient Education and Therapeutic Exercise    Goals of treatment reviewed with Leonette Most? Yes  Home Exercise Program issued? Yes  Education provided: Home Exercise Program, Pain Modulation and Activity Modification

## 2021-07-10 ENCOUNTER — Ambulatory Visit
Admission: RE | Admit: 2021-07-10 | Discharge: 2021-07-10 | Disposition: A | Discharge status: Home / Self Care | Payer: TRICARE Prime—HMO | Attending: Family Practice | Admitting: Family Practice

## 2021-07-10 DIAGNOSIS — M545 Low back pain, unspecified: Secondary | ICD-10-CM | POA: Insufficient documentation

## 2021-07-10 DIAGNOSIS — R2689 Other abnormalities of gait and mobility: Secondary | ICD-10-CM

## 2021-07-10 NOTE — Interdisciplinary (Signed)
PHYSICAL THERAPY DAILY TREATMENT NOTE    Ordering Physician: Hatamy, Esmatullah    Visit Diagnosis:    ICD-10-CM ICD-9-CM    1. Acute bilateral low back pain without sciatica  M54.50 724.2      338.19       2. Decreased functional mobility  R26.89 781.99         Visit Number: 2     Insurance: TRICARE    No past medical history on file.    Past Surgical History:   Procedure Laterality Date   . EAR TUBE REMOVAL Bilateral 1990       Allergies: Patient has no known allergies.    Current Outpatient Medications   Medication Sig Dispense Refill   . ACETAMINOPHEN EXTRA STRENGTH 500 MG tablet TAKE 1 TABLET BY MOUTH EVERY 6 HOURS AS NEEDED FOR PAIN OR FEVER     . methocarbamol (ROBAXIN) 500 MG tablet Take 1 tablet (500 mg) by mouth 2 times daily.     . omeprazole (PRILOSEC) 20 MG capsule      . OMEPRAZOLE PO      . TESTOSTERONE BU      . Testosterone Cypionate 100 MG/ML SOLN        No current facility-administered medications for this encounter.     Start of Care: 06/25/2021                 Weeks since Surgery/Injury/Delivery (0 if N/A): 0        SUBJECTIVE    Subjective information: better    Pain levels:  Location: low back    Intensity:    Current:  0/10  At worse 4/10  Quality: ache    Patient stated goal: go to the gym    OBJECTIVE          TherEx:  HEP  Prone first thing in the morning.  Extension stretches every 2 hours.  Log roll in/out of bed.  Use of Lumbar roll.  Stand every 30 minutes.  Golf pick up.  Off Load     Therex/HEP  HS curl/bridge on ball  Abdominal crunches on ball  GG lunges x3  Single limb heel raise on step  Hip abduction in 1/2 kneel        ASSESSMENT    Patient making good  progress as evidenced by pain now intermittent. Patient presents with deficits involving weakness which demonstrates the need for continued skilled care to address impairments to reach goals set.    Plan for next visit: return in 1 month    Goals  Goal 1 Impairment: Education need  Goal 1: Patient able to return demonstrate Home  Exercise Program independently to enable patient to achieve stated functional goals     Number of Visits: 3-5  Status :cont                              Long Term Goal Impairment: Activity Tolerance Limitation  Long Term Goal: Go back to gym  Number of Visits: 5-7  Status: not met          Long Term Goal Outcome: Patient will decrease Modified ODI by MCID of at least 13% to reflect clinically significant positive change in function since starting therapy  Number of Visits: 5-7  Status: New                  PLAN  Continue therapy to address: Pain;Activity tolerance   limitation  Treatment Frequency: 2 times per month  Treatment Duration: 12 weeks          Therapeutic Procedures     Total TIMED Treatment (min) : 45    Therapeutic exercise  : see objective                  Treatment Time   Total TIMED Treatment  (min): 45  Total Treatment Time (min): 45

## 2021-08-07 ENCOUNTER — Ambulatory Visit (HOSPITAL_BASED_OUTPATIENT_CLINIC_OR_DEPARTMENT_OTHER): Payer: TRICARE Prime—HMO | Admitting: Rehabilitative and Restorative Service Providers"

## 2021-10-14 ENCOUNTER — Telehealth (INDEPENDENT_AMBULATORY_CARE_PROVIDER_SITE_OTHER): Payer: TRICARE Prime—HMO | Admitting: Family Medicine

## 2021-10-14 DIAGNOSIS — F439 Reaction to severe stress, unspecified: Secondary | ICD-10-CM

## 2021-10-14 DIAGNOSIS — K219 Gastro-esophageal reflux disease without esophagitis: Secondary | ICD-10-CM

## 2021-10-14 DIAGNOSIS — Z789 Other specified health status: Secondary | ICD-10-CM

## 2021-10-14 MED ORDER — OMEPRAZOLE 20 MG OR CPDR
20.00 mg | DELAYED_RELEASE_CAPSULE | Freq: Every day | ORAL | 1 refills | Status: DC
Start: 2021-10-14 — End: 2021-10-23

## 2021-10-14 NOTE — Progress Notes (Signed)
FAMILY MEDICINE TELEMEDICINE PROGRESS NOTE    CC:    Chief Complaint   Patient presents with    Referral/authorization    Medication Problem       SUBJECTIVE:    Jonathan Ponce is a 36 year old male who is being seen for the following issues:    ---------------------(data below generated by Reuel Boom, MD)--------------------    Patient Verification & Telemedicine Consent:    I am proceeding with this evaluation at the direct request of the patient.  I have verified this is the correct patient and have obtained verbal consent and written consent from the patient/ surrogate to perform this voluntary telemedicine evaluation (including obtaining history, performing examination and reviewing data provided by the patient).   The patient/ surrogate has the right to refuse this evaluation.  I have explained risks (including potential loss of confidentiality), benefits, alternatives, and the potential need for subsequent face to face care. Patient/ surrogate understands that there is a risk of medical inaccuracies given that our recommendations will be made based on reported data (and we must therefore assume this information is accurate).  Knowing that there is a risk that this information is not reported accurately, and that the telemedicine video, audio, or data feed may be incomplete, the patient agrees to proceed with evaluation and holds [place] harmless knowing these risks. In this evaluation, we will be providing recommendations only.  The ultimate decision to follow, or not follow, these recommendations will be left to the bedside treating/ requesting practitioner.  The patient/ surrogate has been notified that other healthcare professionals (including students, residents and Engineer, maintenance) may be involved in this audio-video evaluation.   All laws concerning confidentiality and patient access to medical records and copies of medical records apply to telemedicine.  The patient/ surrogate has received the  [place] Notice of Privacy Practices.  I have reviewed this above verification and consent paragraph with the patient/ surrogate.  If the patient is not capacitated to understand the above, and no surrogate is available, since this is not an emergency evaluation, the visit will be rescheduled until such time that the patient can consent, or the surrogate is available to consent.    Demographics:   Medical Record #: [id number]   Date: October 14, 2021   Patient Name: Jonathan Ponce   DOB: 03-14-1985  Age: 36 year old  Sex: male  Location: Home address on file      Evaluator(s):   Jonathan Ponce was evaluated by me today.    Clinic Location:  [facility]  [place] [facility]  [address]  [place] [address]    Interval History: Pt had establish care appt via telemed on 06/08/21, this is pt's 2nd appt     #psychology referral  Used to get therapy years ago  Hasn't done since moving around  Wife is on an aircraft carrier, won't see much for the next 1.5 years  Isolated  Works from home, new city  Has stressors, would like to talk to someone   Denies depression and anxiety at this time    #Refill PPI  Had back injury in March, ongoing issue  GERD gets worse when NOT exercising  Has certain food triggers, alcohol triggers  Has episodes where Jonathan Ponce needs to take PPI for a week when not able to exercise and/or when traveling and eating/drinking differently, requests refill          Patient Active  Problem List   Diagnosis    Abnormal finding on evaluation procedure    Disease of pituitary gland (CMS-HCC)    Gastroesophageal reflux disease without esophagitis    Family history of diseases of the skin and subcutaneous tissue    Acute bilateral low back pain without sciatica    History of atypical skin mole     No past medical history on file.  Outpatient Medications Prior to Visit   Medication Sig Dispense Refill    ACETAMINOPHEN EXTRA STRENGTH 500 MG tablet TAKE 1 TABLET BY MOUTH EVERY 6  HOURS AS NEEDED FOR PAIN OR FEVER      methocarbamol (ROBAXIN) 500 MG tablet Take 1 tablet (500 mg) by mouth 2 times daily.      omeprazole (PRILOSEC) 20 MG capsule       OMEPRAZOLE PO       TESTOSTERONE BU       Testosterone Cypionate 100 MG/ML SOLN        No facility-administered medications prior to visit.       OBJECTIVE:  There were no vitals taken for this visit.    Physical Exam  Pulmonary:      Effort: Pulmonary effort is normal.   Neurological:      Mental Status: Jonathan Ponce is alert.         LABS:  No results found for this or any previous visit.    STUDIES:     ASSESSMENT & PLAN:  Jonathan Ponce is a 36 year old male was seen today for:  Jonathan Ponce was seen today for referral/authorization and medication problem.    Diagnoses and all orders for this visit:    Gastroesophageal reflux disease, unspecified whether esophagitis present: Pt has episodic GERD mainly when eating/drinking differently such as when traveling or when not able to exercise.  Episodes respond to a week of PPI and resolve.  Requests refill bc of recent travel and some upcoming travel.    -     omeprazole (PRILOSEC) 20 MG capsule; Take 1 capsule (20 mg) by mouth every morning (before breakfast).    Stress: Requests referral for stress/isolation.  No depression/anxiety at this time.  Has benefited from talking to someone in the past.   -     Consult/Referral to Integrated Behavioral Health    Adequate exercise, at or above goal: PAVS Total Activity: 300 minutes/week minutes/week       I have encouraged [name] to maintain his current physical activity.                    Health Maintenance reviewed -  Health Maintenance   Topic Date Due    Hepatitis C Screening  Never done    Universal HIV Screening  Never done    Lipid Screening  Never done    Tetanus (1 - Tdap) Never done    COVID-19 Vaccine (3 - Moderna series) 09/18/2019    Influenza (1) 12/30/2021    PHQ2 depression screen  06/09/2022    Polio Vaccine  Aged Out    IMM_Hep A Vaccine Series   Aged Out    HPV Vaccine <= 26 Yrs  Aged Out    Meningococcal MCV4 Vaccine  Aged Out    Pneumococcal Vaccine  Aged Out       Patient Instructions   It was nice to meet you today.  I referred you to "integrated behavioral health" so follow up on the referral.  I refilled your omeprazole.  Please make an appointment in person with a resident to discuss testosterone.    Patient verbalized understanding of teaching and instructions, in agreement with plan of care.    Return in about 1 week (around 10/21/2021) for testosterone treatment.      Reuel Boom, MD      Plan discussed with pt including risks/benefits/alternatives including watchful waiting.  Informed pt of 24/7 on call MD.  ED if acutely worsening after hours.  Pt verbalized understanding.    Medications reviewed with patient and medication list reconciled.  Over the counter medications, herbal therapies and supplements reviewed.  Patient's understanding and response to medications assessed.    Barriers to medications assessed and addressed.  Risks, benefits, alternatives to medications reviewed.

## 2021-10-14 NOTE — Interdisciplinary (Signed)
Pre-visit chart review and huddle completed with staff and physician.    Outstanding labs, imaging and consults reviewed and identified.    Health maintanence issues identified and addressed:    Health Maintenance   Topic Date Due    Hepatitis C Screening  Never done    Universal HIV Screening  Never done    Lipid Screening  Never done    Tetanus (1 - Tdap) Never done    COVID-19 Vaccine (3 - Moderna series) 09/18/2019    Influenza (1) 12/30/2021    PHQ2 depression screen  06/09/2022    Polio Vaccine  Aged Out    IMM_Hep A Vaccine Series  Aged Out    HPV Vaccine <= 26 Yrs  Aged Out    Meningococcal MCV4 Vaccine  Aged Out    Pneumococcal Vaccine  Aged Out

## 2021-10-14 NOTE — Patient Instructions (Signed)
It was nice to meet you today.  I referred you to "integrated behavioral health" so follow up on the referral.  I refilled your omeprazole.  Please make an appointment in person with a resident to discuss testosterone.

## 2021-10-19 ENCOUNTER — Ambulatory Visit (INDEPENDENT_AMBULATORY_CARE_PROVIDER_SITE_OTHER): Payer: TRICARE Prime—HMO | Admitting: Sports Medicine

## 2021-10-19 ENCOUNTER — Encounter (INDEPENDENT_AMBULATORY_CARE_PROVIDER_SITE_OTHER): Payer: TRICARE Prime—HMO | Admitting: Family Practice

## 2021-10-19 ENCOUNTER — Encounter (INDEPENDENT_AMBULATORY_CARE_PROVIDER_SITE_OTHER): Payer: Self-pay | Admitting: Sports Medicine

## 2021-10-19 VITALS — BP 122/76 | HR 67 | Temp 98.2°F | Resp 16 | Ht 73.0 in | Wt 194.0 lb

## 2021-10-19 DIAGNOSIS — K219 Gastro-esophageal reflux disease without esophagitis: Secondary | ICD-10-CM

## 2021-10-19 DIAGNOSIS — E860 Dehydration: Secondary | ICD-10-CM

## 2021-10-19 DIAGNOSIS — Z0000 Encounter for general adult medical examination without abnormal findings: Secondary | ICD-10-CM

## 2021-10-19 DIAGNOSIS — E349 Endocrine disorder, unspecified: Secondary | ICD-10-CM

## 2021-10-19 NOTE — Progress Notes (Signed)
Family Medicine Clinic Note    Chief Complaint   Patient presents with    Establish Care     Establish care / refill medication / dehydrated      History of Present Illness      Jonathan Ponce is a 36 year old male who presents to clinic for:    #Medication refill    Recently moved from [address], [place] about 3-4 months ago (wife is in [employer])  Needs updated prescriptions    Meds:  Omeprazole 20 mg for GERD  Testosterone injection thinks most recently was on 0.75 mL every two weeks. Last use about 2.5 weeks ago    #Testosterone deficiency:  Has been on TRT for about 3 years, gradually decreased over time due to side effects (acne, irritability)  Last labs done 5-6 months ago, reports levels were a little higher than before  Started when having low energy, low mood, low libido, erectile dysfunction  Will need refill of syringes as well    Feels very dehydrated recently  Drinks about a gallon of water per day, has also been drinking electrolytes  Sometimes gets lightheaded if he stands up too fast  Family history of MI in his grandfather, but was older, no other family cardiac history  Reports at one time in the past had labs drawn after doing an intense exercise and was told he was having kidney failure, but repeated a few days later and was normal      Review of Systems     As per HPI    Past History     Current Outpatient Medications   Medication Sig Dispense Refill    omeprazole (PRILOSEC) 20 MG capsule Take 1 capsule (20 mg) by mouth every morning (before breakfast). 30 capsule 1    TESTOSTERONE BU       Testosterone Cypionate 100 MG/ML SOLN        No current facility-administered medications for this visit.     No Known Allergies  Patient Active Problem List   Diagnosis    Abnormal finding on evaluation procedure    Disease of pituitary gland (CMS-HCC)    Gastroesophageal reflux disease without esophagitis    Family history of diseases of the skin and subcutaneous tissue    Acute bilateral low back pain  without sciatica    History of atypical skin mole    Adequate exercise, at or above goal     No past medical history on file.  Past Surgical History:   Procedure Laterality Date    EAR TUBE REMOVAL Bilateral 1990     Social History     Socioeconomic History    Marital status: Married   Tobacco Use    Smoking status: Former     Types: Cigarettes, Electronic Cigarette (Smokeless)     Start date: 2005     Quit date: 2019     Years since quitting: 4.6    Smokeless tobacco: Current     Types: Chew   Substance and Sexual Activity    Alcohol use: Yes     Alcohol/week: 5.0 standard drinks of alcohol     Types: 5 Cans of beer per week    Drug use: Never    Sexual activity: Yes     Partners: Female     Birth control/protection: Pill        Physical Exam     BP 122/76 (BP Location: Left arm, BP Patient Position: Sitting, BP cuff size: Regular)  Pulse 67   Temp 98.2 F (36.8 C) (Temporal)   Resp 16   Ht 6\' 1"  (1.854 m)   Wt 88 kg (194 lb)   SpO2 98%   BMI 25.60 kg/m  Body mass index is 25.6 kg/m.    Gen: Alert, no acute distress, non-toxic appearing  Lungs: Clear to auscultation bilaterally. No wheeze/rales/rhonchi   CV: Regular rate & rhythm, normal S1, S2. No murmurs  Extremities: No cyanosis, erythema or edema.       Assessment/Plan     Jonathan Ponce was seen today for establish care.    Diagnoses and all orders for this visit:    Testosterone deficiency  Reports he has been on testosterone injections for the past 3 years or so. Unsure his exact dose, thinks has been using 0.75 mL most recently but dose has been changed over time. Will check labs for current testosterone levels as well as Hct, lipids, and kidney/liver function. Release of records signed today, will try to obtain recent notes, labs, and prescription history for this prior to continuing.   -     Testosterone Free and Total, Adult Male Green Plasma Separator Tube; Future  -     CBC w/ Diff Lavender; Future  -     Lipid Panel Green Plasma Separator Tube;  Future  -     Comprehensive Metabolic Panel Green Plasma Separator Tube; Future    Dehydration  Reports feeling dehydrated recently despite drinking lots of water and some electrolytes. Will check CMP, CBC, and UA to monitor fluid status and electrolytes  -     Urinalysis with Culture Reflex, when indicated; Future  -     CBC w/ Diff Lavender; Future  -     Comprehensive Metabolic Panel Green Plasma Separator Tube; Future    Gastroesophageal reflux disease, unspecified whether esophagitis present  Well-controlled on occasional omeprazole 20 mg    Laboratory exam ordered as part of routine general medical examination  -     Lipid Panel Green Plasma Separator Tube; Future      Follow up  Return if symptoms worsen or fail to improve.    Medication Review:  Medications reviewed with patient and medication list reconciled.  Over the counter medications, herbal therapies and supplements reviewed.  Patient's understanding and response to medications assessed.   Barriers to medications assessed and addressed.   Risks, benefits, alternatives to medications reviewed.    No barriers to learning, verbalizes understanding of teaching and instructions.      Leonette Most, MD  [facility]  Pager [phone]

## 2021-10-22 ENCOUNTER — Other Ambulatory Visit: Payer: TRICARE Prime—HMO | Attending: Sports Medicine

## 2021-10-22 DIAGNOSIS — E860 Dehydration: Secondary | ICD-10-CM | POA: Insufficient documentation

## 2021-10-22 DIAGNOSIS — E349 Endocrine disorder, unspecified: Secondary | ICD-10-CM | POA: Insufficient documentation

## 2021-10-22 DIAGNOSIS — Z0000 Encounter for general adult medical examination without abnormal findings: Secondary | ICD-10-CM | POA: Insufficient documentation

## 2021-10-22 LAB — CBC WITH DIFF, BLOOD
ANC-Automated: 3 10*3/uL (ref 1.6–7.0)
Abs Basophils: 0 10*3/uL (ref <0.2)
Abs Eosinophils: 0 10*3/uL (ref 0.0–0.5)
Abs Lymphs: 1.6 10*3/uL (ref 0.8–3.1)
Abs Monos: 0.4 10*3/uL (ref 0.2–0.8)
Basophils: 0 %
Eosinophils: 1 %
Hct: 42.9 % (ref 40.0–50.0)
Hgb: 14.7 gm/dL (ref 13.7–17.5)
Lymphocytes: 32 %
MCH: 32.8 pg — ABNORMAL HIGH (ref 26.0–32.0)
MCHC: 34.3 g/dL (ref 32.0–36.0)
MCV: 95.8 um3 — ABNORMAL HIGH (ref 79.0–95.0)
MPV: 10.6 fL (ref 9.4–12.4)
Monocytes: 7 %
Plt Count: 240 10*3/uL (ref 140–370)
RBC: 4.48 10*6/uL — ABNORMAL LOW (ref 4.60–6.10)
RDW: 12.9 % (ref 12.0–14.0)
Segs: 59 %
WBC: 5 10*3/uL (ref 4.0–10.0)

## 2021-10-22 LAB — TESTOSTERONE FREE AND TOTAL, ADULT MALE
Sex Hormone Binding Globulin: 30 nmol/L (ref 17–56)
Testosterone (Male): 3.44 ng/mL (ref 2.80–8.00)
Testosterone, % Free, Calculated: 1.9 %
Testosterone-Free, Adult Male, Calculated: 66 pg/mL (ref 47–244)

## 2021-10-22 LAB — URINALYSIS WITH CULTURE REFLEX, WHEN INDICATED
Bilirubin: NEGATIVE
Blood: NEGATIVE
Glucose: NEGATIVE
Ketones: NEGATIVE
Leuk Esterase: NEGATIVE Leu/uL
Nitrite: NEGATIVE
Protein: NEGATIVE
Specific Gravity: 1.011 (ref 1.002–1.030)
Urobilinogen: NEGATIVE
pH: 6 (ref 5.0–8.0)

## 2021-10-22 LAB — COMPREHENSIVE METABOLIC PANEL, BLOOD
ALT (SGPT): 18 U/L (ref 0–41)
AST (SGOT): 18 U/L (ref 0–40)
Albumin: 4.8 g/dL (ref 3.5–5.2)
Alkaline Phos: 48 U/L (ref 40–129)
Anion Gap: 13 mmol/L (ref 7–15)
BUN: 20 mg/dL (ref 6–20)
Bicarbonate: 25 mmol/L (ref 22–29)
Bilirubin, Tot: 0.83 mg/dL (ref <1.2)
Calcium: 10 mg/dL (ref 8.5–10.6)
Chloride: 100 mmol/L (ref 98–107)
Creatinine: 1.25 mg/dL — ABNORMAL HIGH (ref 0.67–1.17)
Glucose: 85 mg/dL (ref 70–99)
Potassium: 5 mmol/L (ref 3.5–5.1)
Sodium: 138 mmol/L (ref 136–145)
Total Protein: 7.6 g/dL (ref 6.0–8.0)
eGFR Based on CKD-EPI 2021 Equation: >60 mL/min/{1.73_m2}

## 2021-10-22 LAB — LIPID(CHOL FRACT) PANEL, BLOOD
Cholesterol: 137 mg/dL (ref <200)
HDL-Cholesterol: 49 mg/dL
LDL-Chol (Calc): 79 mg/dL (ref <160)
Non-HDL Cholesterol: 88 mg/dL
Triglycerides: 47 mg/dL (ref 10–170)

## 2021-10-22 NOTE — Interdisciplinary (Signed)
Blood drawn from left arm with 21 gauge needle. 2 tubes taken.   Patient identity authenticated by Alexander Cabodil.

## 2021-10-23 ENCOUNTER — Other Ambulatory Visit (INDEPENDENT_AMBULATORY_CARE_PROVIDER_SITE_OTHER): Payer: Self-pay | Admitting: Internal Medicine

## 2021-10-23 ENCOUNTER — Encounter (INDEPENDENT_AMBULATORY_CARE_PROVIDER_SITE_OTHER): Payer: Self-pay | Admitting: Sports Medicine

## 2021-10-23 ENCOUNTER — Other Ambulatory Visit (INDEPENDENT_AMBULATORY_CARE_PROVIDER_SITE_OTHER): Payer: Self-pay | Admitting: Family Medicine

## 2021-10-23 DIAGNOSIS — K219 Gastro-esophageal reflux disease without esophagitis: Secondary | ICD-10-CM

## 2021-10-23 DIAGNOSIS — E349 Endocrine disorder, unspecified: Secondary | ICD-10-CM

## 2021-10-23 NOTE — Telephone Encounter (Signed)
Forwarding to Dr Galloway

## 2021-10-23 NOTE — Telephone Encounter (Signed)
Patient is requesting refill via MyChart  Medication requested:   Requested Prescriptions     Pending Prescriptions Disp Refills    Testosterone Cypionate 100 MG/ML SOLN         Last refill: historical   Pharmacy:   [id number] [address], [place] - [address]  [address] [address]  Phone: [phone] Fax: [phone]    Last visit in this department 10/19/2021 w dr Donzetta Matters   Next visit in this department Visit date not found    Last date of urine immunoassay drug screen       Last date of CURES report                        Prescription refill pended and routed to PCP for review/approval if appropriate.

## 2021-10-23 NOTE — Telephone Encounter (Signed)
From: Lowella Bandy  To: Laurence Compton, MD  Sent: 10/23/2021 11:12 AM PDT  Subject: Question regarding COMPREHENSIVE METABOLIC PANEL, BLOOD    Dr. Donzetta Matters,    Noticed I was out of range on some of the kidney functioning. Anything to be concerned about?    Thanks,  Leonette Most

## 2021-10-24 NOTE — Telephone Encounter (Signed)
Patient MyChart request    *The requested med passed all protocol parameters in the associated med info guide - Protocol details reviewed by pharmacist.       Proton Pump Inhibitor Refill Protocol    Last visit in enc specialty: 10/19/2021     Recent Visits in This Encounter Department       Date Provider Department Visit Type Primary Dx    10/19/2021 Laurence Compton, MD [place] [facility] Office Visit Testosterone deficiency    10/14/2021 Reuel Boom, MD [place] [facility] Home Health Telemedicine Gastroesophageal reflux disease, unspecified whether esophagitis present    06/08/2021 Suzy Bouchard, MD [place] [facility] Home Health Telemedicine Acute bilateral low back pain without sciatica           Population Health Visits  Recent [facility] Visits    None       Next appt in enc specialty: Visit date not found      Future Appointments 10/24/2021 - 10/23/2026      None                LABS required:  (None)    Monitoring required:  (None)  -----------------------------------------------------------------------------------------------------------    The above technician note was evaluated by the pharmacist.  Pharmacist Assessment and Plan:    Per 10/19/21 OV:  Gastroesophageal reflux disease, unspecified whether esophagitis present  Well-controlled on occasional omeprazole 20 mg  Auth 90 + 0    Josph Macho, PharmD APh  [facility]   Refill and Prior [employer]  Phone:  [phone]  Ext:  [phone]

## 2021-10-26 MED ORDER — OMEPRAZOLE 20 MG OR CPDR
20.0000 mg | DELAYED_RELEASE_CAPSULE | Freq: Every day | ORAL | 0 refills | Status: AC
Start: 2021-10-26 — End: ?

## 2021-10-26 NOTE — Telephone Encounter (Signed)
Need to confirm patient's previous dose, he was unsure what he was taking when I saw him in clinic.

## 2021-10-27 NOTE — Telephone Encounter (Signed)
0.75 mL per week  See my chart photo

## 2021-10-27 NOTE — Telephone Encounter (Signed)
My chart sent to send photo

## 2021-10-29 MED ORDER — TESTOSTERONE CYPIONATE 200 MG/ML IJ SOLN
0.7500 mL | INTRAMUSCULAR | 0 refills | Status: DC
Start: 2021-11-02 — End: 2022-03-27

## 2021-10-29 NOTE — Telephone Encounter (Signed)
Refill signed, will need to establish with new PCP for continued management (still assigned to Dr. Chauncey Reading)

## 2021-11-04 ENCOUNTER — Encounter (INDEPENDENT_AMBULATORY_CARE_PROVIDER_SITE_OTHER): Payer: Self-pay

## 2021-11-10 ENCOUNTER — Encounter (INDEPENDENT_AMBULATORY_CARE_PROVIDER_SITE_OTHER): Payer: Self-pay | Admitting: Internal Medicine

## 2021-11-10 DIAGNOSIS — Z0000 Encounter for general adult medical examination without abnormal findings: Secondary | ICD-10-CM

## 2021-11-12 ENCOUNTER — Ambulatory Visit (INDEPENDENT_AMBULATORY_CARE_PROVIDER_SITE_OTHER): Payer: TRICARE Prime—HMO

## 2021-11-12 ENCOUNTER — Encounter (INDEPENDENT_AMBULATORY_CARE_PROVIDER_SITE_OTHER): Payer: Self-pay

## 2021-11-12 VITALS — BP 119/77 | HR 66 | Temp 97.5°F | Ht 73.0 in | Wt 194.1 lb

## 2021-11-12 DIAGNOSIS — E349 Endocrine disorder, unspecified: Secondary | ICD-10-CM

## 2021-11-12 DIAGNOSIS — Z840 Family history of diseases of the skin and subcutaneous tissue: Secondary | ICD-10-CM

## 2021-11-12 NOTE — Progress Notes (Unsigned)
Chief Complaint   Patient presents with    Follow Up     Medication refill testosterone        Subjective:   Jonathan Ponce is a(n) 36 year old male who presents for: testosterone refills. He has a past medical hx of GERD, hypoandrogenism, and degenerative disk disease. Patient has been prescribed testosterone for 3 years before moving to [facility]. His previous doctor prescribed it to him due to symptoms of fatigue, erectile dysfunction, and depression.        ROS:  Constitutional: negative for: fatigue, night sweats, weight loss, fever.  CV: negative for:  palpitations, syncope, chest pain, orthopnea, lower extremity edema.  Resp: negative for:  cough, sputum, shortness of breath, wheezing.  GI: negative for: vomiting, nausea, abdominal pain, diarrhea.  GU: negative for: dysuria, frequency, hematuria, urgency.  Neuro: negative for: seizures, paralysis/weakness, numbness or tingling.    History:  No past medical history on file.  Past Surgical History:   Procedure Laterality Date    EAR TUBE REMOVAL Bilateral 1990     No family history on file.  Current Outpatient Medications   Medication Sig Dispense Refill    omeprazole (PRILOSEC) 20 MG capsule Take 1 capsule (20 mg) by mouth every morning (before breakfast). 90 capsule 0    Testosterone Cypionate 200 MG/ML SOLN 0.75 mL by IntraMUSCULAR route once a week. 5 mL 0     No current facility-administered medications for this visit.     No Known Allergies    Objective: BP 119/77 (BP Location: Left arm, BP Patient Position: Sitting, BP cuff size: Large)   Pulse 66   Temp 97.5 F (36.4 C) (Temporal)   Ht 6\' 1"  (1.854 m)   Wt 88.1 kg (194 lb 1.9 oz)   SpO2 98%   BMI 25.61 kg/m Body mass index is 25.61 kg/m.    General Appearance: healthy, alert, no distress, pleasant affect,   Heart:  regular rate and rhythm, S1 and S2, no murmurs, clicks, or gallops.  Lungs: clear to auscultation, no chest deformities noted.  Abdomen: Abdomen soft, non-tender. No masses or  organomegaly. Bowel sounds normal.  Neuro: Non-encephalopathic, CNII-XII intact, strength intact throughout, sensation intact throughout, coordination intact, normal gait.    ASSESSMENT/PLAN:  Jonathan Ponce is a 36 yo M with a pmh of GERD, hypoandrogenism, and degenerative disk disease who presents for testosterone refills after being on testosterone therapy for 3 years.      #Hypoandrogenism  #Fertility Planning  Patient was diagnosed with hypoandrogenism 3 years ago and has been on testosterone replacement therapy. He was feeling fatigued, depressed, and had erectile dysfunction. Patient is also interested in having children in the near future and would like to learn more about navigating this while being on testosterone replacement therapy.   - Endocrinology Clinic referral     #Family history of melanoma  Patient had one skin lesion removed in the past on his back. He has not noticed any other moles or skin lesions.   - Referral to dermatologist    See follow up   Return in about 3 months (around 02/11/2022) for F/U Medications.    Patient was discussed with attending Dr. 02/13/2022, M.D.  [facility], PGY-1

## 2021-11-12 NOTE — Interdisciplinary (Unsigned)
Pre-visit chart review and huddle completed with staff and physician.    Outstanding labs, imaging and consults reviewed and identified.    Health maintanence issues identified and addressed:    Health Maintenance   Topic Date Due    Hepatitis C Screening  Never done    Universal HIV Screening  Never done    Tetanus (1 - Tdap) Never done    Influenza (1) Never done    COVID-19 Vaccine (3 - Moderna series) 10/20/2022 (Originally 09/18/2019)    PHQ2 depression screen  10/15/2022    Lipid Screening  10/23/2026    Polio Vaccine  Aged Out    IMM_Hep A Vaccine Series  Aged Out    HPV Vaccine <= 26 Yrs  Aged Out    Meningococcal MCV4 Vaccine  Aged Out    Pneumococcal Vaccine  Aged Out

## 2021-11-13 ENCOUNTER — Encounter (INDEPENDENT_AMBULATORY_CARE_PROVIDER_SITE_OTHER): Payer: Self-pay | Admitting: Hospital

## 2021-11-16 ENCOUNTER — Encounter (INDEPENDENT_AMBULATORY_CARE_PROVIDER_SITE_OTHER): Payer: Self-pay | Admitting: Hospital

## 2022-03-03 ENCOUNTER — Encounter (INDEPENDENT_AMBULATORY_CARE_PROVIDER_SITE_OTHER): Payer: Self-pay | Admitting: Family Medicine

## 2022-03-04 ENCOUNTER — Encounter (INDEPENDENT_AMBULATORY_CARE_PROVIDER_SITE_OTHER): Payer: Self-pay

## 2022-03-04 NOTE — Telephone Encounter (Signed)
From: [name]  To: Corey Skains, MD  Sent: 03/04/2022 9:54 AM PST  Subject: Doctors note for Massage therapy     Hello,     I recently got an FSA card through work and was wondering if you would provide me with a note to use it for Massage Therapy. As we discussed awhile back, I have some back issues resulting from bulged disks. Massages help the pain greatly.    Please let me know if there's any information you need.     Thanks!  Jonathan Ponce

## 2022-03-27 ENCOUNTER — Other Ambulatory Visit (INDEPENDENT_AMBULATORY_CARE_PROVIDER_SITE_OTHER): Payer: Self-pay

## 2022-03-27 DIAGNOSIS — E349 Endocrine disorder, unspecified: Secondary | ICD-10-CM

## 2022-03-29 MED ORDER — TESTOSTERONE CYPIONATE 200 MG/ML IJ SOLN
0.75 mL | INTRAMUSCULAR | 0 refills | Status: DC
Start: 2022-03-29 — End: 2022-05-05

## 2022-03-29 NOTE — Telephone Encounter (Signed)
Covering for Dr. Pablo Ledger while he is away. Reviewed chart, 3 year history of hypogonadism. Had labs done in 09/2021 that were appropriate to continue therapy. Will refill.     Cures report reviewed

## 2022-05-01 ENCOUNTER — Other Ambulatory Visit (INDEPENDENT_AMBULATORY_CARE_PROVIDER_SITE_OTHER): Payer: Self-pay | Admitting: Family Practice

## 2022-05-01 DIAGNOSIS — E349 Endocrine disorder, unspecified: Secondary | ICD-10-CM

## 2022-05-03 NOTE — Telephone Encounter (Signed)
Patient is requesting refill via MyChart  Medication requested:   Requested Prescriptions     Pending Prescriptions Disp Refills    testosterone cypionate (DEPO-TESTOSTERONE) 200 MG/ML SOLN [Pharmacy Med Name: TESTOSTERONE CYP 200 MG/ML] 5 mL 0     Sig: 0.75 ML BY INTRAMUSCULAR ROUTE ONCE A WEEK.       Last refill: 03/29/2022  [facility]   [id number] [place] [facility] [id number] Phone: [phone]: [phone]   Last visit in this department 11/12/2021  Next visit in this department Visit date not found    Last date of urine immunoassay drug screen       Last date of CURES report                        Prescription refill pended and routed to PCP for review/approval if appropriate.

## 2022-05-03 NOTE — Telephone Encounter (Signed)
[place] [facility]       Controlled substances (Refill and PA requests) are not currently included in Pharmacy Refill Clinic protocol. Re-routing to appropriate staff for processing. Thank you.

## 2022-05-05 MED ORDER — TESTOSTERONE CYPIONATE 200 MG/ML IM SOLN
INTRAMUSCULAR | 0 refills | Status: DC
Start: 2022-05-05 — End: 2022-05-07

## 2022-05-07 ENCOUNTER — Telehealth (INDEPENDENT_AMBULATORY_CARE_PROVIDER_SITE_OTHER): Payer: Self-pay

## 2022-05-07 DIAGNOSIS — E349 Endocrine disorder, unspecified: Secondary | ICD-10-CM

## 2022-05-07 MED ORDER — TESTOSTERONE CYPIONATE 200 MG/ML IM SOLN
INTRAMUSCULAR | 0 refills | Status: DC
Start: 2022-05-07 — End: 2022-06-18

## 2022-05-07 NOTE — Telephone Encounter (Signed)
Patient informed. Rx sent 

## 2022-05-07 NOTE — Telephone Encounter (Signed)
Please send via e-Prescribe. Dr.Trinh is unable to send controlled substances.

## 2022-05-07 NOTE — Telephone Encounter (Signed)
A CURES/PDMP report was reviewed as per the Medical Board [place] guidelines for prescription of controlled medications and was found to be consistent with treatment plan and reported patient history.    Rx sent through the e prescription system    Please advise patient.

## 2022-05-07 NOTE — Telephone Encounter (Signed)
Who is calling: Incoming call from patient  Insurance Coverage Verified: Active- in network  Reason for this call: Pt is not in [facility] and will not be able to pick up Rx. Agent contacted Cassandra for assistance and will call the pt back. FYI    Action required by office: Please contact caller     Duplicate encounter? No previous documentation found on this issue.     Best way to contact: [phone]       Inquiry has been read verbatim to this caller. Verbalizes satisfaction and confirms the above is accurate: yes      Has been advised this message will be transmitted to office and can expect a response within the next 24-72 hours.

## 2022-05-07 NOTE — Telephone Encounter (Signed)
Rx for Testosterone was placed in the front office drawer.

## 2022-06-16 ENCOUNTER — Other Ambulatory Visit (INDEPENDENT_AMBULATORY_CARE_PROVIDER_SITE_OTHER): Payer: Self-pay | Admitting: Family Practice

## 2022-06-16 DIAGNOSIS — E349 Endocrine disorder, unspecified: Secondary | ICD-10-CM

## 2022-06-17 NOTE — Telephone Encounter (Signed)
Incoming call from patient requesting refill  Medication requested:   Requested Prescriptions     Pending Prescriptions Disp Refills    testosterone cypionate (DEPO-TESTOSTERONE) 200 MG/ML SOLN [Pharmacy Med Name: TESTOSTERONE CYP 200 MG/ML] 4 mL 1     Sig: 0.75 ML BY INTRAMUSCULAR ROUTE ONCE A WEEK       Last refill: 05/07/2022  Ph[facility]:   [id number] [place], [place] - [address] AT [facility] [address]  [place] [address]  Phone: [phone] Fax: [phone]    Last visit in this department 11/12/2021  Next visit in this department Visit date not found    Last date of urine immunoassay drug screen       Last date of CURES report                        Prescription refill pended and routed to PCP for review/approval if appropriate.

## 2022-06-17 NOTE — Telephone Encounter (Signed)
[place] [facility]       Controlled substances (Refill and PA requests) are not currently included in Pharmacy Refill Clinic protocol. Re-routing to appropriate staff for processing. Thank you.

## 2022-06-18 MED ORDER — TESTOSTERONE CYPIONATE 200 MG/ML IM SOLN
INTRAMUSCULAR | 1 refills | Status: AC
Start: 2022-06-18 — End: ?

## 2022-10-27 ENCOUNTER — Encounter (INDEPENDENT_AMBULATORY_CARE_PROVIDER_SITE_OTHER): Payer: Self-pay

## 2022-11-03 NOTE — Telephone Encounter (Signed)
Letter sent.
